# Patient Record
Sex: Male | Born: 1995 | Race: White | Hispanic: No | Marital: Single | Smoking: Never smoker
Health system: Southern US, Community
[De-identification: ages and names within clinical notes are randomized; demographics above are authoritative.]

## PROBLEM LIST (undated history)

## (undated) DIAGNOSIS — L709 Acne, unspecified: Secondary | ICD-10-CM

## (undated) DIAGNOSIS — J982 Interstitial emphysema: Secondary | ICD-10-CM

## (undated) DIAGNOSIS — J939 Pneumothorax, unspecified: Secondary | ICD-10-CM

## (undated) DIAGNOSIS — B348 Other viral infections of unspecified site: Secondary | ICD-10-CM

## (undated) DIAGNOSIS — J45909 Unspecified asthma, uncomplicated: Secondary | ICD-10-CM

## (undated) HISTORY — DX: Acne, unspecified: L70.9

## (undated) HISTORY — DX: Interstitial emphysema: J98.2

## (undated) HISTORY — DX: Unspecified asthma, uncomplicated: J45.909

## (undated) HISTORY — DX: Pneumothorax, unspecified: J93.9

## (undated) HISTORY — DX: Other viral infections of unspecified site: B34.8

---
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
## 2017-01-18 ENCOUNTER — Telehealth: Payer: Self-pay | Admitting: Allergy and Immunology

## 2017-01-18 MED ORDER — ALBUTEROL SULFATE HFA 108 (90 BASE) MCG/ACT IN AERS: INHALATION_SPRAY | RESPIRATORY_TRACT | 0 refills | Status: DC

## 2017-01-18 MED ORDER — ALBUTEROL SULFATE (2.5 MG/3ML) 0.083% IN NEBU: 2.5000 mg | INHALATION_SOLUTION | Freq: Four times a day (QID) | RESPIRATORY_TRACT | 0 refills | Status: DC | PRN

## 2017-01-18 NOTE — Telephone Encounter (Signed)
[name] called in and stated he needs a refill on PROVENTIL and PRO AIR.  [facility] [place]  [name] also made an appointment for July 5th.

## 2017-01-24 ENCOUNTER — Ambulatory Visit: Payer: Medicaid Other | Admitting: Allergy and Immunology

## 2017-02-05 ENCOUNTER — Other Ambulatory Visit: Payer: Self-pay | Admitting: Allergy and Immunology

## 2017-02-05 MED ORDER — ALBUTEROL SULFATE HFA 108 (90 BASE) MCG/ACT IN AERS: INHALATION_SPRAY | RESPIRATORY_TRACT | 0 refills | Status: DC

## 2017-02-05 NOTE — Telephone Encounter (Signed)
Mom called in and would like a refill on PROAIR.  [name] was last seen March 2018.

## 2017-03-14 ENCOUNTER — Other Ambulatory Visit: Payer: Self-pay | Admitting: Allergy and Immunology

## 2017-03-18 ENCOUNTER — Telehealth: Payer: Self-pay | Admitting: Allergy and Immunology

## 2017-03-18 NOTE — Telephone Encounter (Signed)
Jason [facility] would like a refill on his PROAIR. [facility] [place]. Shandell was last seen 09-21-2016.  [facility] [address] [place], [place] [address]  [phone]

## 2017-04-22 ENCOUNTER — Other Ambulatory Visit: Payer: Self-pay | Admitting: Allergy and Immunology

## 2017-06-06 ENCOUNTER — Encounter: Payer: Self-pay | Admitting: Allergy and Immunology

## 2017-06-06 ENCOUNTER — Ambulatory Visit (INDEPENDENT_AMBULATORY_CARE_PROVIDER_SITE_OTHER): Payer: Medicaid Other | Admitting: Allergy and Immunology

## 2017-06-06 VITALS — BP 110/80 | HR 84 | Temp 98.2°F | Resp 20

## 2017-06-06 DIAGNOSIS — J4541 Moderate persistent asthma with (acute) exacerbation: Secondary | ICD-10-CM | POA: Diagnosis not present

## 2017-06-06 DIAGNOSIS — J3089 Other allergic rhinitis: Secondary | ICD-10-CM | POA: Diagnosis not present

## 2017-06-06 MED ORDER — IPRATROPIUM-ALBUTEROL 0.5-2.5 (3) MG/3ML IN SOLN: 3.0000 mL | Freq: Four times a day (QID) | RESPIRATORY_TRACT | Status: DC

## 2017-06-06 MED ORDER — METHYLPREDNISOLONE ACETATE 80 MG/ML IJ SUSP
80.0000 mg | Freq: Once | INTRAMUSCULAR | Status: AC
  Administered 2017-06-06: 80 mg via INTRAMUSCULAR

## 2017-06-06 MED ORDER — ALBUTEROL SULFATE HFA 108 (90 BASE) MCG/ACT IN AERS: 2.0000 | INHALATION_SPRAY | RESPIRATORY_TRACT | 0 refills | Status: DC | PRN

## 2017-06-06 MED ORDER — MONTELUKAST SODIUM 10 MG PO TABS: 10.0000 mg | ORAL_TABLET | Freq: Every day | ORAL | 5 refills | Status: DC

## 2017-06-06 MED ORDER — MOMETASONE FURO-FORMOTEROL FUM 200-5 MCG/ACT IN AERO: 2.0000 | INHALATION_SPRAY | Freq: Two times a day (BID) | RESPIRATORY_TRACT | 3 refills | Status: DC

## 2017-06-06 NOTE — Patient Instructions (Signed)
  1. Restart [name] 200 - 2 inhalations two times per day   2. Restart Montelukast 10mg  one tablet one time per day  3. Depomedrol 80 IM delivered in clinic today  4. Nebulized Duoneb delivered in clinic today  5. If needed:   A. ProAir HFA 2 puffs every 4-6 hours with spacer  B. cetirizine 10 mg tablet 1 time a day   6. Return to clinic in 4 weeks or earlier if problem

## 2017-06-06 NOTE — Progress Notes (Signed)
Follow-up Note  Referring Provider: Suann LarryHall, Meghan M, PA-C Primary Provider: Suann LarryHall, Meghan M Date of Office Visit: 06/06/2017  Subjective:   Jason [facility] (DOB: 02/07/1996) is a 21 y.o. male who [facility]2017 in re-evaluation of the following:  HPI: Jason [facility] returns to this clinic and reevaluation of his asthma and allergic rhinoconjunctivitis.  His last visit to this clinic was March 2018.  [place] living in [place] but returned over the course of the past several days.  Apparently he did well but developed a problem about a week ago or so with cough and chest tightness and wheezing and laryngitis without any significant upper airway symptoms.  Unfortunately, he is out of all of his medications for the past 3 weeks.  Allergies as of 06/06/2017   No Known Allergies     Medication List      albuterol (2.5 MG/3ML) 0.083% nebulizer solution Commonly known as:  PROVENTIL TAKE 3MLS BY NEBULIZATION EVERY 6 HOURS AS NEEDED FOR WHEEZING OR SHORTNESS OF BREATH   albuterol 108 (90 Base) MCG/ACT inhaler Commonly known as:  PROVENTIL HFA;VENTOLIN HFA INHALE 2 PUFFS INTO THE LUNGS EVERY 4 (FOUR) HOURS AS NEEDED FOR WHEEZING OR SHORTNESS OF BREATH.       Past Medical History:  Diagnosis Date  . Acne   . Asthma     History reviewed. No pertinent surgical history.  Review of systems negative except as noted in HPI / PMHx or noted below:  Review of Systems  Constitutional: Negative.   HENT: Negative.   Eyes: Negative.   Respiratory: Negative.   Cardiovascular: Negative.   Gastrointestinal: Negative.   Genitourinary: Negative.   Musculoskeletal: Negative.   Skin: Negative.   Neurological: Negative.   Endo/Heme/Allergies: Negative.   Psychiatric/Behavioral: Negative.      Objective:   Vitals:   06/06/17 1035  BP: 110/80  Pulse: 84  Resp: 20  Temp: 98.2 F (36.8 C)          Physical Exam  Constitutional: He is well-developed,  well-nourished, and in no distress.  Raspy voice  HENT:  Head: Normocephalic.  Right Ear: Tympanic membrane, external ear and ear canal normal.  Left Ear: Tympanic membrane, external ear and ear canal normal.  Nose: Nose normal. No mucosal edema or rhinorrhea.  Mouth/Throat: Uvula is midline, oropharynx is clear and moist and mucous membranes are normal. No oropharyngeal exudate.  Eyes: Conjunctivae are normal.  Neck: Trachea normal. No tracheal tenderness present. No tracheal deviation present. No thyromegaly present.  Cardiovascular: Normal rate, regular rhythm, S1 normal, S2 normal and normal heart sounds.  No murmur heard. Pulmonary/Chest: No stridor. No respiratory distress. He has wheezes (Bilateral expiratory wheezes). He has no rales.  Musculoskeletal: He exhibits no edema.  Lymphadenopathy:       Head (right side): No tonsillar adenopathy present.       Head (left side): No tonsillar adenopathy present.    He has no cervical adenopathy.  Neurological: He is alert. Gait normal.  Skin: No rash noted. He is not diaphoretic. No erythema. Nails show no clubbing.  Psychiatric: Mood and affect normal.    Diagnostics:    Spirometry was performed and demonstrated an FEV1 of 3.09 at 74 % of predicted.  The patient had an Asthma Control Test with the following results: ACT Total Score: 19.    Assessment and Plan:   1. Asthma, not well controlled, moderate persistent, with acute exacerbation   2.  Other allergic rhinitis     1. Restart [name] 200 - 2 inhalations two times per day   2. Restart Montelukast 10mg  one tablet one time per day  3. Depomedrol 80 IM delivered in clinic today  4. Nebulized Duoneb delivered in clinic today  5. If needed:   A. ProAir HFA 2 puffs every 4-6 hours with spacer  B. cetirizine 10 mg tablet 1 time a day   6. Return to clinic in 4 weeks or earlier if problem  Jason [facility] obviously has a flare of his respiratory tract disease most likely  triggered off by a viral respiratory tract infection and he will utilize a combination of therapy to get this under better control as noted above and I will see him back in this clinic in 4 weeks or earlier if there is a problem.  Laurette SchimkeEric Keshonda Monsour, MD Allergy / Immunology [facility]

## 2017-06-10 ENCOUNTER — Encounter: Payer: Self-pay | Admitting: Allergy and Immunology

## 2017-08-12 ENCOUNTER — Other Ambulatory Visit: Payer: Self-pay | Admitting: Allergy and Immunology

## 2017-08-12 NOTE — Telephone Encounter (Signed)
Courtesy refill  

## 2017-09-09 ENCOUNTER — Other Ambulatory Visit: Payer: Self-pay | Admitting: *Deleted

## 2017-09-09 ENCOUNTER — Telehealth: Payer: Self-pay | Admitting: Allergy and Immunology

## 2017-09-09 MED ORDER — ALBUTEROL SULFATE HFA 108 (90 BASE) MCG/ACT IN AERS
INHALATION_SPRAY | RESPIRATORY_TRACT | 1 refills | Status: DC
Start: 2017-09-09 — End: 2017-09-11

## 2017-09-09 NOTE — Telephone Encounter (Signed)
Patient is requesting a refill on his albuterol inhaler to his [facility] [facility]

## 2017-09-09 NOTE — Telephone Encounter (Signed)
Rx sent to [facility].   

## 2017-09-11 ENCOUNTER — Ambulatory Visit (INDEPENDENT_AMBULATORY_CARE_PROVIDER_SITE_OTHER): Payer: Medicaid Other | Admitting: Allergy and Immunology

## 2017-09-11 ENCOUNTER — Encounter: Payer: Self-pay | Admitting: Allergy and Immunology

## 2017-09-11 VITALS — BP 110/80 | HR 68 | Resp 20

## 2017-09-11 DIAGNOSIS — J3089 Other allergic rhinitis: Secondary | ICD-10-CM | POA: Diagnosis not present

## 2017-09-11 DIAGNOSIS — J4541 Moderate persistent asthma with (acute) exacerbation: Secondary | ICD-10-CM | POA: Diagnosis not present

## 2017-09-11 MED ORDER — ALBUTEROL SULFATE HFA 108 (90 BASE) MCG/ACT IN AERS: INHALATION_SPRAY | RESPIRATORY_TRACT | 1 refills | Status: DC

## 2017-09-11 MED ORDER — MONTELUKAST SODIUM 10 MG PO TABS: 10.0000 mg | ORAL_TABLET | Freq: Every day | ORAL | 5 refills | Status: DC

## 2017-09-11 MED ORDER — MOMETASONE FURO-FORMOTEROL FUM 200-5 MCG/ACT IN AERO: INHALATION_SPRAY | RESPIRATORY_TRACT | 5 refills | Status: DC

## 2017-09-11 MED ORDER — CETIRIZINE HCL 10 MG PO TABS: ORAL_TABLET | ORAL | 5 refills | Status: DC

## 2017-09-11 NOTE — Progress Notes (Signed)
Follow-up Note  Referring Provider: Suann LarryHall, Meghan M, PA-C Primary Provider: Patient, No Pcp Per Date of Office Visit: 09/11/2017  Subjective:   Jason Richard (DOB: 11/20/1995) is a 22 y.o. male [facility] in re-evaluation of the following:  HPI: Jason Richard returns to this clinic in reevaluation of his asthma and allergic rhinoconjunctivitis.  His last visit to this clinic was 06 June 2017 at which point in time he was having an asthma flare which we addressed with a systemic steroid and reinstitution of his controller agents.  He did very well regarding control of his asthma until he ran out of medications about 3 weeks ago.  Over the course of the past several days he has had to use his bronchodilator multiple times a day.  During the interval he has not required a systemic steroid or antibiotic and when he was using his medications he did not need to use a short acting bronchodilator.  Allergies as of 09/11/2017   No Known Allergies     Medication List      albuterol (2.5 MG/3ML) 0.083% nebulizer solution Commonly known as:  PROVENTIL TAKE 3MLS BY NEBULIZATION EVERY 6 HOURS AS NEEDED FOR WHEEZING OR SHORTNESS OF BREATH   albuterol 108 (90 Base) MCG/ACT inhaler Commonly known as:  PROAIR HFA Inhale two puffs every 4-6 hours if needed for cough or wheeze.       Past Medical History:  Diagnosis Date  . Acne   . Asthma     History reviewed. No pertinent surgical history.  Review of systems negative except as noted in HPI / PMHx or noted below:  Review of Systems  Constitutional: Negative.   HENT: Negative.   Eyes: Negative.   Respiratory: Negative.   Cardiovascular: Negative.   Gastrointestinal: Negative.   Genitourinary: Negative.   Musculoskeletal: Negative.   Skin: Negative.   Neurological: Negative.   Endo/Heme/Allergies: Negative.   Psychiatric/Behavioral: Negative.      Objective:   Vitals:   09/11/17  1400  BP: 110/80  Pulse: 68  Resp: 20          Physical Exam  Constitutional: He is well-developed, well-nourished, and in no distress.  HENT:  Head: Normocephalic.  Right Ear: Tympanic membrane, external ear and ear canal normal.  Left Ear: Tympanic membrane, external ear and ear canal normal.  Nose: Nose normal. No mucosal edema or rhinorrhea.  Mouth/Throat: Uvula is midline, oropharynx is clear and moist and mucous membranes are normal. No oropharyngeal exudate.  Eyes: Conjunctivae are normal.  Neck: Trachea normal. No tracheal tenderness present. No tracheal deviation present. No thyromegaly present.  Cardiovascular: Normal rate, regular rhythm, S1 normal, S2 normal and normal heart sounds.  No murmur heard. Pulmonary/Chest: Breath sounds normal. No stridor. No respiratory distress. He has no wheezes. He has no rales.  Musculoskeletal: He exhibits no edema.  Lymphadenopathy:       Head (right side): No tonsillar adenopathy present.       Head (left side): No tonsillar adenopathy present.    He has no cervical adenopathy.  Neurological: He is alert. Gait normal.  Skin: No rash noted. He is not diaphoretic. No erythema. Nails show no clubbing.  Psychiatric: Mood and affect normal.    Diagnostics:    Spirometry was performed and demonstrated an FEV1 of 3.57 at 99 % of predicted.  Assessment and Plan:   1. Asthma, not well controlled, moderate persistent, with acute exacerbation  2. Other allergic rhinitis     1. Restart [name] 200 - 2 inhalations two times per day   2. Restart Montelukast 10mg  one tablet one time per day  3. If needed:   A. ProAir HFA 2 puffs every 4-6 hours with spacer  B. cetirizine 10 mg tablet 1 time a day   4. Return to clinic in 4 months or earlier if problem  We will have Bram restart his controller agents including a combination inhaler and a leukotriene modifier and assume that he will do well with this therapy.  If he does well I will  see him back in this clinic at the tail end of spring or he will return to this clinic earlier should he be having problems.  Laurette Schimke, MD Allergy / Immunology [facility]

## 2017-09-11 NOTE — Patient Instructions (Signed)
  1. Restart [name] 200 - 2 inhalations two times per day   2. Restart Montelukast 10mg  one tablet one time per day  3. If needed:   A. ProAir HFA 2 puffs every 4-6 hours with spacer  B. cetirizine 10 mg tablet 1 time a day   4. Return to clinic in 4 months or earlier if problem

## 2017-09-12 ENCOUNTER — Encounter: Payer: Self-pay | Admitting: Allergy and Immunology

## 2017-11-05 ENCOUNTER — Other Ambulatory Visit: Payer: Self-pay | Admitting: Allergy and Immunology

## 2018-01-21 ENCOUNTER — Other Ambulatory Visit: Payer: Self-pay | Admitting: Allergy and Immunology

## 2018-02-13 ENCOUNTER — Encounter: Payer: Self-pay | Admitting: Allergy and Immunology

## 2018-04-03 ENCOUNTER — Other Ambulatory Visit: Payer: Self-pay | Admitting: Allergy and Immunology

## 2018-04-03 MED ORDER — ALBUTEROL SULFATE HFA 108 (90 BASE) MCG/ACT IN AERS: INHALATION_SPRAY | RESPIRATORY_TRACT | 0 refills | Status: DC

## 2018-04-03 NOTE — Telephone Encounter (Signed)
Rx sent 

## 2018-04-03 NOTE — Telephone Encounter (Signed)
Jason Richard called in and would like a refill of PROAIR.  I informed Jason Richard he needed to make an appointment.  He has an appointment for 9/19.  He would like his refill sent to [facility] in [place].

## 2018-04-10 ENCOUNTER — Ambulatory Visit: Payer: Medicaid Other | Admitting: Allergy and Immunology

## 2018-04-17 ENCOUNTER — Encounter: Payer: Self-pay | Admitting: Allergy and Immunology

## 2018-04-17 ENCOUNTER — Ambulatory Visit (INDEPENDENT_AMBULATORY_CARE_PROVIDER_SITE_OTHER): Payer: Medicaid Other | Admitting: Allergy and Immunology

## 2018-04-17 VITALS — BP 96/72 | HR 60 | Resp 20 | Ht 67.5 in | Wt 112.6 lb

## 2018-04-17 DIAGNOSIS — J4551 Severe persistent asthma with (acute) exacerbation: Secondary | ICD-10-CM

## 2018-04-17 DIAGNOSIS — J3089 Other allergic rhinitis: Secondary | ICD-10-CM

## 2018-04-17 DIAGNOSIS — K759 Inflammatory liver disease, unspecified: Secondary | ICD-10-CM | POA: Diagnosis not present

## 2018-04-17 MED ORDER — MOMETASONE FURO-FORMOTEROL FUM 200-5 MCG/ACT IN AERO: INHALATION_SPRAY | RESPIRATORY_TRACT | 5 refills | Status: DC

## 2018-04-17 MED ORDER — IPRATROPIUM-ALBUTEROL 0.5-2.5 (3) MG/3ML IN SOLN
3.0000 mL | Freq: Once | RESPIRATORY_TRACT | Status: AC
  Administered 2018-04-17: 3 mL via RESPIRATORY_TRACT

## 2018-04-17 MED ORDER — ALBUTEROL SULFATE (2.5 MG/3ML) 0.083% IN NEBU: INHALATION_SOLUTION | RESPIRATORY_TRACT | 1 refills | Status: DC

## 2018-04-17 MED ORDER — METHYLPREDNISOLONE ACETATE 80 MG/ML IJ SUSP
80.0000 mg | Freq: Once | INTRAMUSCULAR | Status: AC
  Administered 2018-04-17: 80 mg via INTRAMUSCULAR

## 2018-04-17 MED ORDER — TIOTROPIUM BROMIDE MONOHYDRATE 1.25 MCG/ACT IN AERS: 2.0000 | INHALATION_SPRAY | Freq: Every day | RESPIRATORY_TRACT | 5 refills | Status: DC

## 2018-04-17 NOTE — Progress Notes (Signed)
[facility] EXTTAG>XTTAG>EXTTAG>TEXTTAG>[address] A[address]Maxcine HamMarge DuncansOregonEustaquio Boyden [phone] [facility] Apley[address]Maxcine HamMarge DuncansOregonEustaquio Boyden [phone] [facility] [address]becka Apley[address]Maxcine HamMarge DuncansOregonEustaquio Boyden [phone] [facility]Rebecka Apley[address]Maxcine HamMarge [place]ans[place]  ear and ear canal normal.  Nose: Nose normal. No mucosal edema or rhinorrhea.  Mouth/Throat: Uvula is midline, oropharynx is clear and moist and mucous membranes are normal. No oropharyngeal exudate.  Eyes: Conjunctivae are normal.  Neck: Trachea normal. No tracheal tenderness present. No tracheal deviation present. No thyromegaly present.  Cardiovascular: Normal rate, regular rhythm, S1 normal, S2 normal and normal heart sounds.  No murmur heard. Pulmonary/Chest: Breath sounds normal. No stridor. No respiratory distress. He has no wheezes. He has no rales.  Musculoskeletal: He exhibits no  edema.  Lymphadenopathy:       Head (right side): No tonsillar adenopathy present.       Head (left side): No tonsillar adenopathy present.    He has no cervical adenopathy.  Neurological: He is alert.  Skin: No rash noted. He is not diaphoretic. No erythema. Nails show no clubbing.    Diagnostics:    Spirometry was performed and demonstrated an FEV1 of 3.06 at 69 % of predicted.  The patient had an Asthma Control Test with the following results: ACT Total Score: 15.    Assessment and Plan:   1. Asthma, not well controlled, severe persistent, with acute exacerbation   2. Other allergic rhinitis   3. Hepatitis     1. Restart [name] 200 - 2 inhalations two times per day   2. Restart Montelukast 10mg  one tablet one time per day  3.  Start Spiriva Respimat 1.252 inhalations every morning  4.  Depo-Medrol 80 IM delivered in clinic today  5.  DuoNeb delivered in clinic today  6. If needed:   A. ProAir HFA 2 puffs or albuterol nebulization every 4-6 hours with spacer  B. cetirizine 10 mg tablet 1 time a day   4. Return to clinic in 3 weeks or earlier if problem  5.  Obtain all blood test results 2019 from [facility]  Albaro appears to have respiratory tract inflammation which we will address with the therapy noted above.  He apparently also has developed hepatitis associated with weight loss and I will review all of the blood test that have been obtained in 2019 and make a decision about whether or not he has a requirement for further evaluation regarding this issue as well.  I will see him back in this clinic in 3 weeks or earlier if there is a problem.  Laurette Schimke, MD Allergy / Immunology [facility]

## 2018-04-17 NOTE — Patient Instructions (Addendum)
  1. Restart [name] 200 - 2 inhalations two times per day   2. Restart Montelukast 10mg  one tablet one time per day  3.  Start Spiriva Respimat 1.252 inhalations every morning  4.  Depo-Medrol 80 IM delivered in clinic today  5.  DuoNeb delivered in clinic today  6. If needed:   A. ProAir HFA 2 puffs or albuterol nebulization every 4-6 hours with spacer  B. cetirizine 10 mg tablet 1 time a day   4. Return to clinic in 3 weeks or earlier if problem  5.  Obtain all blood test results 2019 from [facility]

## 2018-04-23 ENCOUNTER — Encounter: Payer: Self-pay | Admitting: Allergy and Immunology

## 2018-05-01 ENCOUNTER — Other Ambulatory Visit: Payer: Self-pay

## 2018-05-01 MED ORDER — ALBUTEROL SULFATE HFA 108 (90 BASE) MCG/ACT IN AERS: INHALATION_SPRAY | RESPIRATORY_TRACT | 0 refills | Status: DC

## 2018-05-01 NOTE — Telephone Encounter (Signed)
Refill requested by patient for albuterol inhaler. I am sending this in. I also reminded him that he is scheduled for an appointment next Thursday.

## 2018-05-08 ENCOUNTER — Ambulatory Visit: Payer: Medicaid Other | Admitting: Allergy and Immunology

## 2018-06-09 ENCOUNTER — Ambulatory Visit: Payer: Self-pay | Admitting: Allergy and Immunology

## 2018-06-16 ENCOUNTER — Ambulatory Visit (INDEPENDENT_AMBULATORY_CARE_PROVIDER_SITE_OTHER): Payer: Medicaid Other | Admitting: Allergy and Immunology

## 2018-06-16 ENCOUNTER — Encounter: Payer: Self-pay | Admitting: Allergy and Immunology

## 2018-06-16 VITALS — BP 110/70 | HR 78 | Resp 16

## 2018-06-16 DIAGNOSIS — J4551 Severe persistent asthma with (acute) exacerbation: Secondary | ICD-10-CM | POA: Diagnosis not present

## 2018-06-16 DIAGNOSIS — J3089 Other allergic rhinitis: Secondary | ICD-10-CM

## 2018-06-16 MED ORDER — MONTELUKAST SODIUM 10 MG PO TABS: 10.0000 mg | ORAL_TABLET | Freq: Every day | ORAL | 5 refills | Status: DC

## 2018-06-16 MED ORDER — ALBUTEROL SULFATE HFA 108 (90 BASE) MCG/ACT IN AERS: INHALATION_SPRAY | RESPIRATORY_TRACT | 1 refills | Status: DC

## 2018-06-16 MED ORDER — TIOTROPIUM BROMIDE MONOHYDRATE 1.25 MCG/ACT IN AERS: 1.0000 | INHALATION_SPRAY | Freq: Every day | RESPIRATORY_TRACT | 5 refills | Status: DC

## 2018-06-16 MED ORDER — MOMETASONE FURO-FORMOTEROL FUM 200-5 MCG/ACT IN AERO: INHALATION_SPRAY | RESPIRATORY_TRACT | 5 refills | Status: DC

## 2018-06-16 NOTE — Progress Notes (Signed)
Follow-up Note  Referring Provider: No ref. provider found Primary Provider: Patient, No Pcp Per Date of Office Visit: 06/16/2018  Subjective:   Jason Richard (DOB: 1995-12-29) is a 22 y.o. male who [facility] 06[facility]/2018 in re-evaluation of the following:  HPI: Jason Richard presents to this clinic in reevaluation of asthma and allergic rhinoconjunctivitis.  His last visit to this clinic was 17 April 2018 at which point time he was having an exacerbation of his asthma for which we had him restart all of his anti-inflammatory medications for his airway and gave him a systemic steroid.  As is usually the case he really did quite well and stopped using all of his medications and unfortunately this week he developed some nasal congestion runny nose and coughing and wheezing and had to use a short acting bronchodilator.  He restarted his [name] and Spiriva this weekend.  Allergies as of 06/16/2018   No Known Allergies     Medication List      albuterol (2.5 MG/3ML) 0.083% nebulizer solution Commonly known as:  PROVENTIL Can use one vial in nebulizer every four to six hours as needed for cough or wheeze.   albuterol 108 (90 Base) MCG/ACT inhaler Commonly known as:  PROVENTIL HFA;VENTOLIN HFA INHALE TWO PUFFS EVERY 4-6 HOURS IF NEEDED FOR COUGH OR WHEEZE.   cetirizine 10 MG tablet Commonly known as:  ZYRTEC Can take one tablet by mouth once daily if needed.   montelukast 10 MG tablet Commonly known as:  SINGULAIR Take 1 tablet (10 mg total) by mouth at bedtime.   Tiotropium Bromide Monohydrate 1.25 MCG/ACT Aers Inhale 2 Doses into the lungs daily. Rinse, gargle, and spit after use.       Past Medical History:  Diagnosis Date  . Acne   . Asthma     History reviewed. No pertinent surgical history.  Review of systems negative except as noted in HPI / PMHx or noted below:  Review of Systems  Constitutional: Negative.   HENT: Negative.     Eyes: Negative.   Respiratory: Negative.   Cardiovascular: Negative.   Gastrointestinal: Negative.   Genitourinary: Negative.   Musculoskeletal: Negative.   Skin: Negative.   Neurological: Negative.   Endo/Heme/Allergies: Negative.   Psychiatric/Behavioral: Negative.      Objective:   Vitals:   06/16/18 1553  BP: 110/70  Pulse: 78  Resp: 16  SpO2: 99%          Physical Exam  HENT:  Head: Normocephalic.  Right Ear: Tympanic membrane, external ear and ear canal normal.  Left Ear: Tympanic membrane, external ear and ear canal normal.  Nose: Nose normal. No mucosal edema or rhinorrhea.  Mouth/Throat: Uvula is midline, oropharynx is clear and moist and mucous membranes are normal. No oropharyngeal exudate.  Eyes: Conjunctivae are normal.  Neck: Trachea normal. No tracheal tenderness present. No tracheal deviation present. No thyromegaly present.  Cardiovascular: Normal rate, regular rhythm, S1 normal, S2 normal and normal heart sounds.  No murmur heard. Pulmonary/Chest: No stridor. No respiratory distress. He has wheezes (Bilateral expiratory wheezing posterior lung fields). He has no rales.  Musculoskeletal: He exhibits no edema.  Lymphadenopathy:       Head (right side): No tonsillar adenopathy present.       Head (left side): No tonsillar adenopathy present.    He has no cervical adenopathy.  Neurological: He is alert.  Skin: No rash noted. He is not diaphoretic. No erythema. Nails  show no clubbing.    Diagnostics:    Spirometry was performed and demonstrated an FEV1 of 3.53 at 80 % of predicted.  The patient had an Asthma Control Test with the following results: ACT Total Score: 19.    Assessment and Plan:   1. Asthma, not well controlled, severe persistent, with acute exacerbation   2. Other allergic rhinitis     1. Restart [name] 200 - 2 inhalations two times per day   2. Restart Montelukast 10mg  one tablet one time per day  3. Restart Spiriva Respimat  1.252 inhalations every morning  4. Prednisone 40mg  delivered in clinic today  5. If needed:   A. ProAir HFA 2 puffs or albuterol nebulization every 4-6 hours with spacer  B. cetirizine 10 mg tablet 1 time a day   6. Return to clinic in 12 weeks or earlier if problem  [name] appears to have developed a respiratory tract infection which is giving rise to inflammation of his airway.  I have encouraged him to use his anti-inflammatory medications for his airway in a preventative mode rather than reserving them for whenever he gets sick.  Hopefully with this plan he will do quite well.  He will keep in contact with me noting his response to this approach.  I will see him back in this clinic in 12 weeks or earlier if there is a problem.  Laurette SchimkeEric Morris Longenecker, MD Allergy / Immunology [facility]

## 2018-06-16 NOTE — Patient Instructions (Signed)
  1. Restart [name] 200 - 2 inhalations two times per day   2. Restart Montelukast 10mg  one tablet one time per day  3. Restart Spiriva Respimat 1.252 inhalations every morning  4. Prednisone 40mg  delivered in clinic today  5. If needed:   A. ProAir HFA 2 puffs or albuterol nebulization every 4-6 hours with spacer  B. cetirizine 10 mg tablet 1 time a day   6. Return to clinic in 12 weeks or earlier if problem

## 2018-06-17 ENCOUNTER — Encounter: Payer: Self-pay | Admitting: Allergy and Immunology

## 2018-06-22 DIAGNOSIS — J939 Pneumothorax, unspecified: Secondary | ICD-10-CM

## 2018-06-22 DIAGNOSIS — J982 Interstitial emphysema: Secondary | ICD-10-CM

## 2018-06-22 HISTORY — DX: Interstitial emphysema: J98.2

## 2018-06-22 HISTORY — DX: Pneumothorax, unspecified: J93.9

## 2018-06-26 ENCOUNTER — Observation Stay: Admission: AD | Admit: 2018-06-26 | Payer: Self-pay | Admitting: Family Medicine

## 2018-07-01 ENCOUNTER — Telehealth: Payer: Self-pay

## 2018-07-01 MED ORDER — ENOXAPARIN SODIUM 40 MG/0.4ML ~~LOC~~ SOLN
40.00 | SUBCUTANEOUS | Status: DC
Start: 2018-07-03 — End: 2018-07-01

## 2018-07-01 MED ORDER — ALBUTEROL SULFATE (2.5 MG/3ML) 0.083% IN NEBU
2.50 | INHALATION_SOLUTION | RESPIRATORY_TRACT | Status: DC
Start: ? — End: 2018-07-01

## 2018-07-01 MED ORDER — HYDROCODONE-ACETAMINOPHEN 5-325 MG PO TABS
1.00 | ORAL_TABLET | ORAL | Status: DC
Start: ? — End: 2018-07-01

## 2018-07-01 MED ORDER — BENZONATATE 100 MG PO CAPS
100.00 | ORAL_CAPSULE | ORAL | Status: DC
Start: ? — End: 2018-07-01

## 2018-07-01 MED ORDER — CETIRIZINE HCL 10 MG PO TABS
10.00 | ORAL_TABLET | ORAL | Status: DC
Start: 2018-07-03 — End: 2018-07-01

## 2018-07-01 MED ORDER — SENNOSIDES-DOCUSATE SODIUM 8.6-50 MG PO TABS
1.00 | ORAL_TABLET | ORAL | Status: DC
Start: ? — End: 2018-07-01

## 2018-07-01 MED ORDER — SALINE NASAL SPRAY 0.65 % NA SOLN
1.00 | NASAL | Status: DC
Start: ? — End: 2018-07-01

## 2018-07-01 MED ORDER — TIOTROPIUM BROMIDE MONOHYDRATE 18 MCG IN CAPS
1.00 | ORAL_CAPSULE | RESPIRATORY_TRACT | Status: DC
Start: 2018-07-03 — End: 2018-07-01

## 2018-07-01 MED ORDER — BUDESONIDE-FORMOTEROL FUMARATE 160-4.5 MCG/ACT IN AERO
2.00 | INHALATION_SPRAY | RESPIRATORY_TRACT | Status: DC
Start: 2018-07-02 — End: 2018-07-01

## 2018-07-01 MED ORDER — MONTELUKAST SODIUM 10 MG PO TABS
10.00 | ORAL_TABLET | ORAL | Status: DC
Start: 2018-07-02 — End: 2018-07-01

## 2018-07-01 MED ORDER — PREDNISONE 10 MG PO TABS
40.00 | ORAL_TABLET | ORAL | Status: DC
Start: 2018-07-03 — End: 2018-07-01

## 2018-07-01 MED ORDER — ALBUTEROL SULFATE (2.5 MG/3ML) 0.083% IN NEBU
2.50 | INHALATION_SOLUTION | RESPIRATORY_TRACT | Status: DC
Start: 2018-07-02 — End: 2018-07-01

## 2018-07-01 NOTE — Telephone Encounter (Signed)
[facility] called [facility] and [facility]. He states he is having some air pockets behind the lungs. He is wanting to speak to Dr Lucie LeatherKozlow about this issue. He is also wondering if insurance will cover an air purifier .   Please Advise

## 2018-07-03 ENCOUNTER — Encounter: Payer: Self-pay | Admitting: Allergy and Immunology

## 2018-07-03 ENCOUNTER — Ambulatory Visit (INDEPENDENT_AMBULATORY_CARE_PROVIDER_SITE_OTHER): Payer: Medicaid Other | Admitting: Allergy and Immunology

## 2018-07-03 VITALS — BP 110/78 | HR 120 | Temp 98.5°F | Resp 20

## 2018-07-03 DIAGNOSIS — J982 Interstitial emphysema: Secondary | ICD-10-CM | POA: Diagnosis not present

## 2018-07-03 DIAGNOSIS — J4551 Severe persistent asthma with (acute) exacerbation: Secondary | ICD-10-CM | POA: Diagnosis not present

## 2018-07-03 DIAGNOSIS — J3089 Other allergic rhinitis: Secondary | ICD-10-CM | POA: Diagnosis not present

## 2018-07-03 NOTE — Progress Notes (Signed)
Follow-up Note  Referring Provider: No ref. provider found Primary Provider: Patient, No Pcp Per Date of Office Visit: 07/03/2018  Subjective:   Jason Richard (DOB: January 30, 1996) is a 22 y.o. male who [facility] 07[facility]/2018 in re-evaluation of the following:  HPI: Jason Richard presents to this clinic in evaluation of asthma and allergic rhinoconjunctivitis.  He was last seen in this clinic on 16 June 2018 at which point in time he was having a asthma exacerbation for which he restarted all of his anti-inflammatory medications.  Approximately 10 days after that visit he ended up in [place] emergency room with continued respiratory tract problems and chest pain and was found to have pneumomediastinum and was transferred to [facility] who stabilized his condition, confirmed pneumomediastinum and bilateral pneumothoraces, and discharged him yesterday.  At this point he feels as though he is doing relatively well.  He does not have that much cough and he is not short of breath and is not using a short acting bronchodilator.  He does continue to use [name] and montelukast and Spiriva.  There has not really been an obvious reason why he had pneumomediastinum and normal pneumothorax.  He did not have any trauma to his chest and he did not have a significant infection involving his respiratory tract.  He did have viral culture documenting rhinovirus while at [facility].  Allergies as of 07/03/2018   No Known Allergies     Medication List      albuterol (2.5 MG/3ML) 0.083% nebulizer solution Commonly known as:  PROVENTIL Can use one vial in nebulizer every four to six hours as needed for cough or wheeze.   albuterol 108 (90 Base) MCG/ACT inhaler Commonly known as:  PROAIR HFA INHALE TWO PUFFS EVERY 4-6 HOURS IF NEEDED FOR COUGH OR WHEEZE.   cetirizine 10 MG tablet Commonly known as:  ZYRTEC Can take one tablet by mouth once daily if needed.   mometasone-formoterol  200-5 MCG/ACT Aero Commonly known as:  [name] Inhale two puffs twice daily to prevent cough or wheeze.  Rinse, gargle, and spit after use.   montelukast 10 MG tablet Commonly known as:  SINGULAIR Take 1 tablet (10 mg total) by mouth at bedtime.   predniSONE 10 MG tablet Commonly known as:  DELTASONE Then take 30 mg daily for 2 days,  Then take 20 mg for 2 days,  Then take 10mg  for 2 days   Tiotropium Bromide Monohydrate 1.25 MCG/ACT Aers Commonly known as:  SPIRIVA RESPIMAT Inhale 1 Dose into the lungs daily. Rinse, gargle, and spit after use.   tiotropium 18 MCG inhalation capsule Commonly known as:  SPIRIVA Place into inhaler and inhale.       Past Medical History:  Diagnosis Date  . Acne   . Asthma   . Bilateral pneumothoraces 06/2018  . Pneumomediastinum (HCC) 06/2018  . Rhinovirus infection 06/2014    History reviewed. No pertinent surgical history.  Review of systems negative except as noted in HPI / PMHx or noted below:  Review of Systems  Constitutional: Negative.   HENT: Negative.   Eyes: Negative.   Respiratory: Negative.   Cardiovascular: Negative.   Gastrointestinal: Negative.   Genitourinary: Negative.   Musculoskeletal: Negative.   Skin: Negative.   Neurological: Negative.   Endo/Heme/Allergies: Negative.   Psychiatric/Behavioral: Negative.      Objective:   Vitals:   07/03/18 1342  BP: 110/78  Pulse: (!) 120  Resp: 20  Temp: 98.5 F (  36.9 C)  SpO2: 91%          Physical Exam Constitutional:      Appearance: He is not diaphoretic.  HENT:     Head: Normocephalic.     Right Ear: Tympanic membrane, ear canal and external ear normal.     Left Ear: Tympanic membrane, ear canal and external ear normal.     Nose: Nose normal. No mucosal edema or rhinorrhea.     Mouth/Throat:     Pharynx: Uvula midline. No oropharyngeal exudate.  Eyes:     Conjunctiva/sclera: Conjunctivae normal.  Neck:     Thyroid: No thyromegaly.     Trachea:  Trachea normal. No tracheal tenderness or tracheal deviation.  Cardiovascular:     Rate and Rhythm: Normal rate and regular rhythm.     Heart sounds: Normal heart sounds, S1 normal and S2 normal. No murmur.     Comments: Crepitance left anterior chest wall Pulmonary:     Effort: No respiratory distress.     Breath sounds: Normal breath sounds. No stridor. No wheezing or rales.  Lymphadenopathy:     Head:     Right side of head: No tonsillar adenopathy.     Left side of head: No tonsillar adenopathy.     Cervical: No cervical adenopathy.  Skin:    Findings: No erythema or rash.     Nails: There is no clubbing.   Neurological:     Mental Status: He is alert.     Diagnostics:    Spirometry was not performed.  The patient had an Asthma Control Test with the following results: ACT Total Score: 21.    Assessment and Plan:   1. Asthma, not well controlled, severe persistent, with acute exacerbation   2. Other allergic rhinitis   3. Pneumomediastinum (HCC)     1. [name] 200 HFA - 2 inhalations two times per day with spacer  2. Montelukast 10mg  one tablet one time per day  3. Spiriva Respimat 1.25 -2 inhalations every morning  4. If needed:   A. ProAir HFA 2 puffs or albuterol nebulization every 4-6 hours  B. cetirizine 10 mg tablet 1 time a day  5.  Continue prednisone taper  6. Return to clinic in 1 week or earlier if problem  Obviously [name] had a tear of his airway into his mediastinum during his recent asthma exacerbation.  I did have a talk with him today about the need to use his medications preventatively so he does not develop severe asthma exacerbations especially over the course of the next year given the fact that he has demonstrated a tear in his airway.  I will see him back in this clinic in 1 week to assess his response to this approach.  Laurette SchimkeEric Teaira Croft, MD Allergy / Immunology [facility]

## 2018-07-03 NOTE — Patient Instructions (Addendum)
  1. [name] 200 HFA - 2 inhalations two times per day with spacer  2. Montelukast 10mg  one tablet one time per day  3. Spiriva Respimat 1.25 -2 inhalations every morning  4. If needed:   A. ProAir HFA 2 puffs or albuterol nebulization every 4-6 hours  B. cetirizine 10 mg tablet 1 time a day  5.  Continue prednisone taper  6. Return to clinic in 1 week or earlier if problem

## 2018-07-07 ENCOUNTER — Encounter: Payer: Self-pay | Admitting: Allergy and Immunology

## 2018-07-10 ENCOUNTER — Ambulatory Visit: Payer: Medicaid Other | Admitting: Allergy and Immunology

## 2018-07-28 ENCOUNTER — Ambulatory Visit: Payer: Self-pay | Admitting: Allergy and Immunology

## 2018-08-25 ENCOUNTER — Ambulatory Visit (INDEPENDENT_AMBULATORY_CARE_PROVIDER_SITE_OTHER): Payer: Medicaid Other | Admitting: Allergy and Immunology

## 2018-08-25 VITALS — BP 98/62 | HR 80 | Resp 18 | Ht 67.0 in | Wt 107.0 lb

## 2018-08-25 DIAGNOSIS — J3089 Other allergic rhinitis: Secondary | ICD-10-CM

## 2018-08-25 DIAGNOSIS — J4550 Severe persistent asthma, uncomplicated: Secondary | ICD-10-CM

## 2018-08-25 NOTE — Patient Instructions (Addendum)
  1.  Continue [name] 200 HFA - 2 inhalations two times per day with spacer  2.  Continue montelukast 10mg  one tablet one time per day  3.  Continue Spiriva Respimat 1.25 -2 inhalations every morning  4. If needed:   A. ProAir HFA 2 puffs or albuterol nebulization every 4-6 hours  B. cetirizine 10 mg tablet 1 time a day  5.  Prednisone 10mg  -2 tablets 1 time per day for 5 days only  6. Return to clinic in 12 weeks or earlier if problem  7.  Review results of chest x-ray from ER.

## 2018-08-25 NOTE — Progress Notes (Signed)
Follow-up Note  Referring Provider: No ref. provider found Primary Provider: Patient, No Pcp Per Date of Office Visit: 08/25/2018  Subjective:   Jason Richard (DOB: Aug 27, 1995) is a 23 y.o. male [facility] in re-evaluation of the following:  HPI: Jason Richard returns to this clinic in reevaluation of his asthma and allergic rhinoconjunctivitis.  I last saw him in this clinic on 03 July 2018 at which point in time he was being evaluated for a recent pneumomediastinum and bilateral pneumothoraces.  During his last visit he was doing well and was undergoing a steroid taper.  He was instructed to come back in 1 week but has not returned to this clinic until today.  Overall he felt very well and he continued on [name] and montelukast and Spiriva and rarely used any short acting bronchodilator and all of his respiratory tract symptoms abated.  However, 1 week ago he developed a head cold with nasal congestion nose blowing and coughing and has had some wheezing.  Although his head has cleared he still has a little bit of a cough.  He went to the emergency room yesterday and a chest x-ray apparently was normal and he was not given any therapy other than a cough suppressant.  He does not have any sputum production or ugly nasal discharge or anosmia but he does have left-sided chest pain.  This only occurs when he coughs.  There is no tenderness.  The only other issue that is new for him that may be responsible for his chest pain is that he started weight lifting about 2 weeks ago.  Allergies as of 08/25/2018   No Known Allergies     Medication List      albuterol (2.5 MG/3ML) 0.083% nebulizer solution Commonly known as:  PROVENTIL Can use one vial in nebulizer every four to six hours as needed for cough or wheeze.   albuterol 108 (90 Base) MCG/ACT inhaler Commonly known as:  PROAIR HFA INHALE TWO PUFFS EVERY 4-6 HOURS IF NEEDED FOR COUGH OR  WHEEZE.   cetirizine 10 MG tablet Commonly known as:  ZYRTEC Can take one tablet by mouth once daily if needed.   mometasone-formoterol 200-5 MCG/ACT Aero Commonly known as:  [name] Inhale two puffs twice daily to prevent cough or wheeze.  Rinse, gargle, and spit after use.   montelukast 10 MG tablet Commonly known as:  SINGULAIR Take 1 tablet (10 mg total) by mouth at bedtime.   Tiotropium Bromide Monohydrate 1.25 MCG/ACT Aers Commonly known as:  SPIRIVA RESPIMAT Inhale 1 Dose into the lungs daily. Rinse, gargle, and spit after use.   tiotropium 18 MCG inhalation capsule Commonly known as:  SPIRIVA Place into inhaler and inhale.       Past Medical History:  Diagnosis Date  . Acne   . Asthma   . Bilateral pneumothoraces 06/2018  . Pneumomediastinum (HCC) 06/2018  . Rhinovirus infection 06/2014    No past surgical history on file.  Review of systems negative except as noted in HPI / PMHx or noted below:  Review of Systems  Constitutional: Negative.   HENT: Negative.   Eyes: Negative.   Respiratory: Negative.   Cardiovascular: Negative.   Gastrointestinal: Negative.   Genitourinary: Negative.   Musculoskeletal: Negative.   Skin: Negative.   Neurological: Negative.   Endo/Heme/Allergies: Negative.   Psychiatric/Behavioral: Negative.      Objective:   Vitals:   08/25/18 1732  BP: 98/62  Pulse: 80  Resp: 18   Height: 5\' 7"  (170.2 cm)  Weight: 107 lb (48.5 kg)   Physical Exam Constitutional:      Appearance: He is not diaphoretic.  HENT:     Head: Normocephalic.     Right Ear: Tympanic membrane, ear canal and external ear normal.     Left Ear: Tympanic membrane, ear canal and external ear normal.     Nose: Nose normal. No mucosal edema or rhinorrhea.     Mouth/Throat:     Pharynx: Uvula midline. No oropharyngeal exudate.  Eyes:     Conjunctiva/sclera: Conjunctivae normal.  Neck:     Thyroid: No thyromegaly.     Trachea: Trachea normal. No  tracheal tenderness or tracheal deviation.  Cardiovascular:     Rate and Rhythm: Normal rate and regular rhythm.     Heart sounds: Normal heart sounds, S1 normal and S2 normal. No murmur.  Pulmonary:     Effort: No respiratory distress.     Breath sounds: No stridor. Wheezing (Scattered expiratory wheezes in the lung bases bilaterally) present. No rales.  Lymphadenopathy:     Head:     Right side of head: No tonsillar adenopathy.     Left side of head: No tonsillar adenopathy.     Cervical: No cervical adenopathy.  Skin:    Findings: No erythema or rash.     Nails: There is no clubbing.   Neurological:     Mental Status: He is alert.     Diagnostics:    Spirometry was performed and demonstrated an FEV1 of 2.79 at 65 % of predicted.  The patient had an Asthma Control Test with the following results: ACT Total Score: 22.    Results of a chest x-ray obtained 24 August 2018 identified the following:  Cardiovascular/Mediastinum: Cardiopericardial silhouette and pulmonary vasculature are within normal limits. Mediastinum is within normal limits. No pneumomediastinum. Lungs/pleura: No focal airspace opacity. No pneumothorax. No pleural effusion.   Upper abdomen: Visible portions of the upper abdomen are within normal limits.   Chest wall/osseous structures: No acute osseous abnormality.   Assessment and Plan:   1. Not well controlled severe persistent asthma   2. Other allergic rhinitis     1.  Continue [name] 200 HFA - 2 inhalations two times per day with spacer  2.  Continue montelukast 10mg  one tablet one time per day  3.  Continue Spiriva Respimat 1.25 -2 inhalations every morning  4. If needed:   A. ProAir HFA 2 puffs or albuterol nebulization every 4-6 hours  B. cetirizine 10 mg tablet 1 time a day  5.  Prednisone 10mg  -2 tablets 1 time per day for 5 days only  6. Return to clinic in 12 weeks or earlier if problem  7.  Review results of chest x-ray from  ER.  L…O appears to have had some inflammation develop within his airway most likely from a viral respiratory tract infection which appears to be resolving.  He still has evidence of inflammation of his lungs and I will give him a relatively short course of systemic steroids.  I suspect that his left-sided chest pain which appears to coincide with coughing episodes is a musculoskeletal issue at this point.  I will review the the results of his chest x-ray that was taken in the emergency room recently.  I will see him back in his clinic in 12 weeks or earlier if there is a problem.  Laurette Schimke, MD Allergy /  Immunology [facility]

## 2018-08-26 ENCOUNTER — Encounter: Payer: Self-pay | Admitting: Allergy and Immunology

## 2018-09-29 ENCOUNTER — Other Ambulatory Visit: Payer: Self-pay

## 2018-09-29 MED ORDER — TIOTROPIUM BROMIDE MONOHYDRATE 1.25 MCG/ACT IN AERS: 1.0000 | INHALATION_SPRAY | Freq: Every day | RESPIRATORY_TRACT | 5 refills | Status: DC

## 2018-09-29 MED ORDER — MOMETASONE FURO-FORMOTEROL FUM 200-5 MCG/ACT IN AERO: INHALATION_SPRAY | RESPIRATORY_TRACT | 5 refills | Status: DC

## 2018-09-29 MED ORDER — ALBUTEROL SULFATE HFA 108 (90 BASE) MCG/ACT IN AERS: INHALATION_SPRAY | RESPIRATORY_TRACT | 1 refills | Status: DC

## 2018-09-29 MED ORDER — MONTELUKAST SODIUM 10 MG PO TABS: 10.0000 mg | ORAL_TABLET | Freq: Every day | ORAL | 5 refills | Status: DC

## 2018-11-12 ENCOUNTER — Other Ambulatory Visit: Payer: Self-pay

## 2018-11-12 ENCOUNTER — Telehealth: Payer: Self-pay | Admitting: *Deleted

## 2018-11-12 MED ORDER — MOMETASONE FURO-FORMOTEROL FUM 200-5 MCG/ACT IN AERO: INHALATION_SPRAY | RESPIRATORY_TRACT | 4 refills | Status: DC

## 2018-11-12 MED ORDER — TIOTROPIUM BROMIDE MONOHYDRATE 1.25 MCG/ACT IN AERS: 2.0000 | INHALATION_SPRAY | Freq: Every day | RESPIRATORY_TRACT | 4 refills | Status: DC

## 2018-11-12 MED ORDER — CETIRIZINE HCL 10 MG PO TABS: ORAL_TABLET | ORAL | 4 refills | Status: DC

## 2018-11-12 MED ORDER — MONTELUKAST SODIUM 10 MG PO TABS: 10.0000 mg | ORAL_TABLET | Freq: Every day | ORAL | 4 refills | Status: DC

## 2018-11-12 MED ORDER — ALBUTEROL SULFATE (2.5 MG/3ML) 0.083% IN NEBU: INHALATION_SOLUTION | RESPIRATORY_TRACT | 1 refills | Status: DC

## 2018-11-12 MED ORDER — ALBUTEROL SULFATE HFA 108 (90 BASE) MCG/ACT IN AERS: INHALATION_SPRAY | RESPIRATORY_TRACT | 1 refills | Status: DC

## 2018-11-12 NOTE — Telephone Encounter (Signed)
Patient needs refills on all medications sent to [facility] in [place]. [address] [place] [address]. Please call pt and let him know once this has been done.

## 2018-11-12 NOTE — Telephone Encounter (Signed)
Refills sent and patient advised.  

## 2018-12-29 ENCOUNTER — Telehealth: Payer: Self-pay | Admitting: Allergy and Immunology

## 2018-12-29 MED ORDER — MONTELUKAST SODIUM 10 MG PO TABS: 10.0000 mg | ORAL_TABLET | Freq: Every day | ORAL | 1 refills | Status: DC

## 2018-12-29 MED ORDER — CETIRIZINE HCL 10 MG PO TABS: ORAL_TABLET | ORAL | 1 refills | Status: DC

## 2018-12-29 MED ORDER — MOMETASONE FURO-FORMOTEROL FUM 200-5 MCG/ACT IN AERO: INHALATION_SPRAY | RESPIRATORY_TRACT | 1 refills | Status: DC

## 2018-12-29 MED ORDER — ALBUTEROL SULFATE HFA 108 (90 BASE) MCG/ACT IN AERS: INHALATION_SPRAY | RESPIRATORY_TRACT | 1 refills | Status: DC

## 2018-12-29 MED ORDER — TIOTROPIUM BROMIDE MONOHYDRATE 1.25 MCG/ACT IN AERS: 2.0000 | INHALATION_SPRAY | Freq: Every day | RESPIRATORY_TRACT | 1 refills | Status: DC

## 2018-12-29 NOTE — Telephone Encounter (Signed)
Jason Richard is moving to [place], [place].  Jason Richard would like to know if there is an allergist in that area that Dr. Neldon Mc recommends.  Jason Richard would like a call back.

## 2018-12-29 NOTE — Telephone Encounter (Signed)
Please inform patient that I do not know of any allergist in that area.  We can continue to provide him medications for 1 year legally as long as his insurance company will allow [place] to do so.  He can check the [employer] website to see about allergist located in that area.

## 2018-12-29 NOTE — Telephone Encounter (Signed)
Jason Richard would like all of his medications refilled and sent to [facility] in [place].  Jason Richard is moving to [place] and would like refills before he leaves.

## 2018-12-29 NOTE — Telephone Encounter (Signed)
Advised patient that we can fill up to one year from last visit but his MCD will not cover out of state meds he will need to fill and then apply for MCD there and get new allergist.  I did send 90 refill to [facility] in [place]

## 2018-12-29 NOTE — Telephone Encounter (Signed)
Patient advised of instructions. 

## 2023-04-29 ENCOUNTER — Encounter: Payer: Self-pay | Admitting: Allergy and Immunology

## 2023-04-29 ENCOUNTER — Ambulatory Visit (INDEPENDENT_AMBULATORY_CARE_PROVIDER_SITE_OTHER): Payer: Medicaid Other | Admitting: Allergy and Immunology

## 2023-04-29 VITALS — BP 112/62 | HR 64 | Resp 16 | Ht 67.5 in | Wt 119.2 lb

## 2023-04-29 DIAGNOSIS — J4521 Mild intermittent asthma with (acute) exacerbation: Secondary | ICD-10-CM | POA: Diagnosis not present

## 2023-04-29 DIAGNOSIS — J3089 Other allergic rhinitis: Secondary | ICD-10-CM | POA: Diagnosis not present

## 2023-04-29 MED ORDER — FLUTICASONE PROPIONATE HFA 110 MCG/ACT IN AERO: INHALATION_SPRAY | RESPIRATORY_TRACT | 3 refills | Status: DC

## 2023-04-29 MED ORDER — CETIRIZINE HCL 10 MG PO TABS: ORAL_TABLET | ORAL | 1 refills | Status: AC

## 2023-04-29 NOTE — Progress Notes (Unsigned)
Jason Richard - [place] - [place] - [place] - [place]   Follow-up Note  Referring Provider: No ref. provider found Primary Provider: Patient, No Pcp Per Date of Office Visit: 04/29/2023  Subjective:   Jason Richard (DOB: 13-Feb-1996) is a 27 y.o. male who [facility] 04[facility]23 in re-evaluation of the following:  HPI: Jason Richard returns to this clinic in evaluation of asthma and allergic rhinoconjunctivitis.  I last saw him in this clinic 25 August 2018.  I had seen him in the past for very severe asthma with a pattern of severe exacerbations occurring intermittently with prolonged asymptomatic intervals of time with a history of pneumomediastinum/pneumothorax.  And that has been his pattern as well over the course of the past few years.  In regard to this past year, he has really done well and rarely uses a short acting bronchodilator and has not been on any controller agents and has had very little issues with either his upper or lower airway and can exercise with any difficulty and does not have any cold air bronchospastic triggers.  Unfortunately, he had exposure to a product that he placed down in his household for fleas and once he had exposure to that agent he became very symptomatic regarding wheezing and coughing over the course of the past 2 weeks and has been to the emergency room twice for that issue.  He is presently using prednisone and he was given a short acting bronchodilator.  He is much better at this point in time.  Allergies as of 04/29/2023   No Known Allergies      Medication List    albuterol (2.5 MG/3ML) 0.083% nebulizer solution Commonly known as: PROVENTIL Can use one vial in nebulizer every four to six hours as needed for cough or wheeze.   albuterol 108 (90 Base) MCG/ACT inhaler Commonly known as: ProAir HFA Can inhale two puffs every four to six hours as needed for cough or wheeze.   cetirizine 10 MG tablet Commonly  known as: ZYRTEC Can take one tablet by mouth once daily if needed.   predniSONE 20 MG tablet Commonly known as: DELTASONE Take by mouth.   PRIMATENE MIST IN Inhale into the lungs as needed.    Past Medical History:  Diagnosis Date   Acne    Asthma    Bilateral pneumothoraces 06/2018   Pneumomediastinum (HCC) 06/2018   Rhinovirus infection 06/2014    History reviewed. No pertinent surgical history.  Review of systems negative except as noted in HPI / PMHx or noted below:  Review of Systems  Constitutional: Negative.   HENT: Negative.    Eyes: Negative.   Respiratory: Negative.    Cardiovascular: Negative.   Gastrointestinal: Negative.   Genitourinary: Negative.   Musculoskeletal: Negative.   Skin: Negative.   Neurological: Negative.   Endo/Heme/Allergies: Negative.   Psychiatric/Behavioral: Negative.       Objective:   Vitals:   04/29/23 0900  BP: 112/62  Pulse: 64  Resp: 16  SpO2: 99%   Height: 5' 7.5" (171.5 cm)  Weight: 119 lb 3.2 oz (54.1 kg)   Physical Exam Constitutional:      Appearance: He is not diaphoretic.  HENT:     Head: Normocephalic.     Right Ear: Tympanic membrane, ear canal and external ear normal.     Left Ear: Tympanic membrane, ear canal and external ear normal.     Nose: Nose normal. No mucosal edema or rhinorrhea.  Mouth/Throat:     Pharynx: Uvula midline. No oropharyngeal exudate.  Eyes:     Conjunctiva/sclera: Conjunctivae normal.  Neck:     Thyroid: No thyromegaly.     Trachea: Trachea normal. No tracheal tenderness or tracheal deviation.  Cardiovascular:     Rate and Rhythm: Normal rate and regular rhythm.     Heart sounds: Normal heart sounds, S1 normal and S2 normal. No murmur heard. Pulmonary:     Effort: No respiratory distress.     Breath sounds: Normal breath sounds. No stridor. No wheezing or rales.  Lymphadenopathy:     Head:     Right side of head: No tonsillar adenopathy.     Left side of head: No  tonsillar adenopathy.     Cervical: No cervical adenopathy.  Skin:    Findings: No erythema or rash.     Nails: There is no clubbing.  Neurological:     Mental Status: He is alert.     Diagnostics: Spirometry was performed and demonstrated an FEV1 of 4.01 at 94 % of predicted.  Assessment and Plan:   1. Asthma, not well controlled, mild intermittent, with acute exacerbation   2. Other allergic rhinitis     1.  Use the following combination of inhalers, together, if needed:      A. Albuterol HFA - 2 inhalations every 4-6 hours  B. Fluticasone 110 - 2 inhalations every 4-6 hours  C. Use empty lung technique  2. Can use cetirizine 10 mg - 1 tablet 1 time per day if needed  3. Finish prednisone  4. Obtain fall flu vaccine  5. Return to clinic in 12 weeks or earlier if problem  Jason Richard has really done very well since his last visit but it is over the course of the past few weeks since he has had exposure to treatment for fleas that he has developed an exacerbation of his asthma.  Because of his intermittent pattern of asthma we are going to have him rely on an anti-inflammatory rescue plan as noted above whenever he develops symptoms of asthma.  If for some reason this plan is insufficient maintaining good control of his asthma we will obviously need to change this plan.  I will see him back in this clinic in 12 weeks or earlier if there is a problem.  Laurette Schimke, MD Allergy / Immunology [facility]

## 2023-04-29 NOTE — Patient Instructions (Addendum)
  1.  Use the following combination of inhalers, together, if needed:      A. Albuterol HFA - 2 inhalations every 4-6 hours  B. Fluticasone 110 - 2 inhalations every 4-6 hours  C. Use empty lung technique  2. Can use cetirizine 10 mg - 1 tablet 1 time per day if needed  3. Finish prednisone  4. Obtain fall flu vaccine  5. Return to clinic in 12 weeks or earlier if problem

## 2023-04-30 ENCOUNTER — Encounter: Payer: Self-pay | Admitting: Allergy and Immunology

## 2023-07-08 ENCOUNTER — Ambulatory Visit (INDEPENDENT_AMBULATORY_CARE_PROVIDER_SITE_OTHER): Payer: Medicaid Other | Admitting: Allergy and Immunology

## 2023-07-08 ENCOUNTER — Encounter: Payer: Self-pay | Admitting: Allergy and Immunology

## 2023-07-08 VITALS — BP 112/62 | HR 70 | Resp 16

## 2023-07-08 DIAGNOSIS — J4520 Mild intermittent asthma, uncomplicated: Secondary | ICD-10-CM | POA: Diagnosis not present

## 2023-07-08 DIAGNOSIS — J3089 Other allergic rhinitis: Secondary | ICD-10-CM | POA: Diagnosis not present

## 2023-07-08 MED ORDER — FLUTICASONE PROPIONATE HFA 110 MCG/ACT IN AERO: 2.0000 | INHALATION_SPRAY | Freq: Two times a day (BID) | RESPIRATORY_TRACT | 3 refills | Status: AC

## 2023-07-08 MED ORDER — VENTOLIN HFA 108 (90 BASE) MCG/ACT IN AERS: INHALATION_SPRAY | RESPIRATORY_TRACT | 2 refills | Status: DC

## 2023-07-08 NOTE — Progress Notes (Signed)
[place] - [place] - [place] - [place] - [place]   Follow-up Note  Referring Provider: No ref. provider found Primary Provider: Patient, No Pcp Per Date of Office Visit: 07/08/2023  Subjective:   Jason Richard (DOB: August 24, 1995) is a 27 y.o. male who [facility] 07[facility]/2023 in re-evaluation of the following:  HPI: [name] returns to this clinic in evaluation of asthma and allergic rhinoconjunctivitis.  I last saw him in this clinic 29 April 2023.  He has really done very well since his last visit and has not required a systemic steroid or an antibiotic and does not really use much medications other than some occasional albuterol but that is relatively rare.  He can exert himself without any problem.  He can have cold air exposure without any problem.  He has had very little problems with his nose or eyes.  Allergies as of 07/08/2023   No Known Allergies      Medication List    cetirizine 10 MG tablet Commonly known as: ZYRTEC Can take one tablet by mouth once daily if needed.   fluticasone 110 MCG/ACT inhaler Commonly known as: FLOVENT HFA Inhale 2 puffs into the lungs 2 (two) times daily.   Ventolin HFA 108 (90 Base) MCG/ACT inhaler Generic drug: albuterol 2 inhalations every 4-6 hours as needed     Past Medical History:  Diagnosis Date   Acne    Asthma    Bilateral pneumothoraces 06/2018   Pneumomediastinum (HCC) 06/2018   Rhinovirus infection 06/2014    History reviewed. No pertinent surgical history.  Review of systems negative except as noted in HPI / PMHx or noted below:  Review of Systems  Constitutional: Negative.   HENT: Negative.    Eyes: Negative.   Respiratory: Negative.    Cardiovascular: Negative.   Gastrointestinal: Negative.   Genitourinary: Negative.   Musculoskeletal: Negative.   Skin: Negative.   Neurological: Negative.   Endo/Heme/Allergies: Negative.   Psychiatric/Behavioral: Negative.        Objective:   Vitals:   07/08/23 0816  BP: 112/62  Pulse: 70  Resp: 16  SpO2: 97%          Physical Exam Constitutional:      Appearance: He is not diaphoretic.  HENT:     Head: Normocephalic.     Right Ear: Tympanic membrane, ear canal and external ear normal.     Left Ear: Tympanic membrane, ear canal and external ear normal.     Nose: Nose normal. No mucosal edema or rhinorrhea.     Mouth/Throat:     Pharynx: Uvula midline. No oropharyngeal exudate.  Eyes:     Conjunctiva/sclera: Conjunctivae normal.  Neck:     Thyroid: No thyromegaly.     Trachea: Trachea normal. No tracheal tenderness or tracheal deviation.  Cardiovascular:     Rate and Rhythm: Normal rate and regular rhythm.     Heart sounds: Normal heart sounds, S1 normal and S2 normal. No murmur heard. Pulmonary:     Effort: No respiratory distress.     Breath sounds: Normal breath sounds. No stridor. No wheezing or rales.  Lymphadenopathy:     Head:     Right side of head: No tonsillar adenopathy.     Left side of head: No tonsillar adenopathy.     Cervical: No cervical adenopathy.  Skin:    Findings: No erythema or rash.     Nails: There is no clubbing.  Neurological:  Mental Status: He is alert.     Diagnostics: none  Assessment and Plan:   1. Asthma, mild intermittent, well-controlled   2. Other allergic rhinitis    1.  Use the following combination of inhalers, together, if needed:      A. Albuterol HFA - 2 inhalations every 4-6 hours  B. Fluticasone 110 - 2 inhalations every 4-6 hours  2. Can use cetirizine 10 mg - 1 tablet 1 time per day if needed  3. Return to clinic in 12 months or earlier if problem  [name] is doing very well and he will continue on an anti-inflammatory rescue medicine plan should it be required as noted above and should he have difficulty as he moves through the next year on this plan he can contact me for further evaluation and treatment.  Laurette Schimke,  MD Allergy / Immunology [facility]

## 2023-07-08 NOTE — Patient Instructions (Signed)
  1.  Use the following combination of inhalers, together, if needed:      A. Albuterol HFA - 2 inhalations every 4-6 hours  B. Fluticasone 110 - 2 inhalations every 4-6 hours  2. Can use cetirizine 10 mg - 1 tablet 1 time per day if needed  3. Return to clinic in 12 months or earlier if problem

## 2023-07-09 ENCOUNTER — Encounter: Payer: Self-pay | Admitting: Allergy and Immunology

## 2024-01-08 ENCOUNTER — Other Ambulatory Visit: Payer: Self-pay

## 2024-01-08 MED ORDER — VENTOLIN HFA 108 (90 BASE) MCG/ACT IN AERS: INHALATION_SPRAY | RESPIRATORY_TRACT | 2 refills | Status: DC

## 2024-03-28 NOTE — Progress Notes (Signed)
 Location Information: Patient State (at time of visit): [place]  Patient Location (at time of visit):Home/Other Non-Medical  Provider Location: [place]  Is provider licensed to provide clinical care in the current location/state of the patient? Yes   Consent:  Patient's identity was confirmed. Presenting condition or illness was discussed with the patient/personal representative. Current proposed treatment for presenting condition or illness was explained to patient/personal representative along with the likely benefits and any significant risks or complications associated with the provision of treatment by audio/video means. The patient/personal representative verbally authorized treatment to be provided by audio/video, which may include a limited review of patient's current health status, medication, or other treatment recommendations, patient education, and an opportunity to ask questions about condition and treatment. Verbal Consent Granted by Patient/Personal Representative:Yes   Visit Information: Modality: 2-Way Real-Time Audio/Video  Video Start Time: 08:20 Video Stop Time: 08:32 Video Total Time: 12 min    The patient presents for evaluation of strep throat symptoms.  They began experiencing a runny nose last night after being in a crowded movie theater. A colleague reported congestion and throat pain yesterday.   This morning, they woke up with significant throat pain and noticed redness and irritation in their throat, but no tonsillar swelling. They report no lymphadenopathy or cough.   They have frequent headaches and are unsure if the eye pressure is related. No white patches or unilateral tonsillar swelling. History of strep throat several years ago.  Their young daughter and girlfriend's son were ill recently but tested negative for COVID-19. They took 600 mg of ibuprofen  today.  Review of Systems  Constitutional:  Negative for chills and fever.   HENT:  Positive for rhinorrhea and sore throat.   Respiratory:  Negative for cough and wheezing.   Musculoskeletal:  Negative for arthralgias.  Neurological:  Positive for headaches.         Objective  There were no vitals taken for this visit.  Physical Exam Constitutional:      Appearance: Normal appearance.  HENT:     Right Ear: External ear normal.     Left Ear: External ear normal.     Nose: Rhinorrhea present.     Mouth/Throat:     Pharynx: Posterior oropharyngeal erythema present.     Tonsils: No tonsillar exudate or tonsillar abscesses. 2+ on the right. 2+ on the left.     Comments: Patient self palpates neck and denies lymphadenopathy  Eyes:     General:        Right eye: No discharge.        Left eye: No discharge.     Extraocular Movements: Extraocular movements intact.     Conjunctiva/sclera: Conjunctivae normal.  Pulmonary:     Effort: Pulmonary effort is normal. No respiratory distress.     Breath sounds: No stridor.     Comments: Able to speak in fluid sentences with no gasping.  No audible wheeze Musculoskeletal:     Cervical back: Neck supple. No rigidity.  Skin:    Coloration: Skin is not cyanotic, jaundiced or pale.     Comments: Lips are pink w/o evidence of cyanosis  Neurological:     Mental Status: He is alert.  Psychiatric:        Mood and Affect: Mood normal.        Behavior: Behavior normal.        Thought Content: Thought content normal.           Assessment & Plan  Pharyngitis, unspecified etiology        Pharyngitis  Patient's Centor criteria of 4/2 with tonsillar edema and absence of cough.   Advised pt that w/ this Centor criteria, chances of strep pharyngitis is 50/50. Advised testing for SARS-COV2 to r/o viral etiology.   Patient would like to begin empiric treatment if covid 19 test is negative.   Will send in Amoxicillin 500mg  BID for 10 days. [place] for 24 hours while testing for Covid 19.   Will also send in  Bromfed elixir to use Q 4-6 hours as needed for congestion and throat inflammation.    Here are some things you can try at home:    1.  Rest and stay well hydrated- drink plenty of water.   2.  Use Tylenol  or Advil  as needed for low grade fever, throat pain, or headache.   3.  Use over the counter sore throat sprays and lozenges (Chloraseptic or Cepacol) for sore throat pain.   4.  You may also do warm salt water gargles as needed.    Additionally, I recommend you stay home from work until symptoms are improving. I also recommend avoiding sharing drinks or food or kissing until you have completed treatment. Furthermore, I recommend changing out your toothbrush in a few days to prevent re-infection.   Please seek prompt attention for worsening symptoms: fever 101 or greater for more than 3 days, difficulty swallowing, chest pain, wheezing, shortness of breath or coughing up dark or bloody mucus.

## 2024-05-08 ENCOUNTER — Other Ambulatory Visit: Payer: Self-pay | Admitting: *Deleted

## 2024-05-08 MED ORDER — VENTOLIN HFA 108 (90 BASE) MCG/ACT IN AERS: INHALATION_SPRAY | RESPIRATORY_TRACT | 0 refills | Status: AC
